# Patient Record
Sex: Female | Born: 1985 | Race: White | Hispanic: No | Marital: Single | Smoking: Current every day smoker
Health system: Southern US, Community
[De-identification: ages and names within clinical notes are randomized; demographics above are authoritative.]

## PROBLEM LIST (undated history)

## (undated) DIAGNOSIS — F32A Depression, unspecified: Secondary | ICD-10-CM

## (undated) DIAGNOSIS — F419 Anxiety disorder, unspecified: Secondary | ICD-10-CM

## (undated) DIAGNOSIS — B019 Varicella without complication: Secondary | ICD-10-CM

## (undated) DIAGNOSIS — F329 Major depressive disorder, single episode, unspecified: Secondary | ICD-10-CM

## (undated) DIAGNOSIS — R768 Other specified abnormal immunological findings in serum: Secondary | ICD-10-CM

## (undated) HISTORY — DX: Varicella without complication: B01.9

## (undated) HISTORY — DX: Other specified abnormal immunological findings in serum: R76.8

## (undated) HISTORY — DX: Major depressive disorder, single episode, unspecified: F32.9

## (undated) HISTORY — DX: Anxiety disorder, unspecified: F41.9

## (undated) HISTORY — DX: Depression, unspecified: F32.A

---
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
## 2007-08-09 ENCOUNTER — Emergency Department: Payer: Self-pay

## 2007-08-09 ENCOUNTER — Other Ambulatory Visit: Payer: Self-pay

## 2008-01-03 ENCOUNTER — Other Ambulatory Visit: Payer: Self-pay

## 2008-01-03 ENCOUNTER — Emergency Department: Payer: Self-pay | Admitting: Emergency Medicine

## 2008-04-07 ENCOUNTER — Emergency Department: Payer: Self-pay | Admitting: Internal Medicine

## 2008-04-28 ENCOUNTER — Emergency Department: Payer: Self-pay | Admitting: Emergency Medicine

## 2008-07-25 ENCOUNTER — Observation Stay: Payer: Self-pay

## 2008-07-26 ENCOUNTER — Ambulatory Visit: Payer: Self-pay

## 2008-07-28 ENCOUNTER — Inpatient Hospital Stay: Payer: Self-pay | Admitting: Obstetrics and Gynecology

## 2008-07-31 ENCOUNTER — Observation Stay: Payer: Self-pay | Admitting: Obstetrics and Gynecology

## 2008-08-05 ENCOUNTER — Observation Stay: Payer: Self-pay

## 2008-08-05 ENCOUNTER — Ambulatory Visit: Payer: Self-pay

## 2008-08-11 ENCOUNTER — Observation Stay: Payer: Self-pay

## 2008-08-19 ENCOUNTER — Observation Stay: Payer: Self-pay | Admitting: Certified Nurse Midwife

## 2008-08-22 ENCOUNTER — Observation Stay: Payer: Self-pay | Admitting: Obstetrics and Gynecology

## 2008-08-25 ENCOUNTER — Observation Stay: Payer: Self-pay

## 2008-08-25 ENCOUNTER — Other Ambulatory Visit: Payer: Self-pay

## 2008-08-28 ENCOUNTER — Observation Stay: Payer: Self-pay

## 2008-08-29 ENCOUNTER — Inpatient Hospital Stay: Payer: Self-pay

## 2008-09-04 ENCOUNTER — Ambulatory Visit: Payer: Self-pay | Admitting: Pain Medicine

## 2008-11-23 ENCOUNTER — Emergency Department: Payer: Self-pay | Admitting: Emergency Medicine

## 2010-07-08 ENCOUNTER — Emergency Department: Payer: Self-pay | Admitting: Internal Medicine

## 2010-11-21 HISTORY — PX: EYE SURGERY: SHX253

## 2012-03-07 ENCOUNTER — Ambulatory Visit: Payer: Self-pay | Admitting: Family Medicine

## 2012-03-20 ENCOUNTER — Ambulatory Visit: Payer: Self-pay | Admitting: Family Medicine

## 2012-06-21 ENCOUNTER — Emergency Department: Payer: Self-pay | Admitting: Emergency Medicine

## 2012-06-21 LAB — COMPREHENSIVE METABOLIC PANEL
Alkaline Phosphatase: 51 U/L (ref 50–136)
BUN: 13 mg/dL (ref 7–18)
Bilirubin,Total: 0.5 mg/dL (ref 0.2–1.0)
Calcium, Total: 8.8 mg/dL (ref 8.5–10.1)
Co2: 27 mmol/L (ref 21–32)
Creatinine: 0.69 mg/dL (ref 0.60–1.30)
EGFR (Non-African Amer.): >60
Glucose: 114 mg/dL — ABNORMAL HIGH (ref 65–99)
Osmolality: 282 (ref 275–301)
SGOT(AST): 29 U/L (ref 15–37)
SGPT (ALT): 33 U/L
Total Protein: 7.4 g/dL (ref 6.4–8.2)

## 2012-06-21 LAB — URINALYSIS, COMPLETE
Glucose,UR: NEGATIVE mg/dL (ref 0–75)
Ketone: NEGATIVE
Nitrite: NEGATIVE
Ph: 5 (ref 4.5–8.0)
Protein: NEGATIVE
RBC,UR: 3 /HPF (ref 0–5)
WBC UR: NONE SEEN /HPF (ref 0–5)

## 2012-06-21 LAB — LIPASE, BLOOD: Lipase: 161 U/L (ref 73–393)

## 2012-06-21 LAB — CBC
HCT: 38.4 % (ref 35.0–47.0)
MCHC: 34.9 g/dL (ref 32.0–36.0)
Platelet: 152 10*3/uL (ref 150–440)
RDW: 13 % (ref 11.5–14.5)

## 2012-06-21 LAB — PREGNANCY, URINE: Pregnancy Test, Urine: NEGATIVE m[IU]/mL

## 2012-12-21 ENCOUNTER — Emergency Department: Payer: Self-pay | Admitting: Emergency Medicine

## 2013-06-15 ENCOUNTER — Emergency Department: Payer: Self-pay | Admitting: Emergency Medicine
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
## 2013-08-20 ENCOUNTER — Emergency Department: Payer: Self-pay | Admitting: Emergency Medicine

## 2013-08-20 LAB — CBC
HGB: 13.3 g/dL (ref 12.0–16.0)
MCH: 33.2 pg (ref 26.0–34.0)
MCHC: 35.8 g/dL (ref 32.0–36.0)
Platelet: 164 10*3/uL (ref 150–440)
RBC: 4 10*6/uL (ref 3.80–5.20)
RDW: 13.3 % (ref 11.5–14.5)
WBC: 9.2 10*3/uL (ref 3.6–11.0)

## 2013-08-21 LAB — DRUG SCREEN, URINE
Barbiturates, Ur Screen: NEGATIVE (ref ?–200)
Cannabinoid 50 Ng, Ur ~~LOC~~: NEGATIVE (ref ?–50)
Cocaine Metabolite,Ur ~~LOC~~: NEGATIVE (ref ?–300)
MDMA (Ecstasy)Ur Screen: NEGATIVE (ref ?–500)
Methadone, Ur Screen: NEGATIVE (ref ?–300)
Opiate, Ur Screen: NEGATIVE (ref ?–300)
Tricyclic, Ur Screen: NEGATIVE (ref ?–1000)

## 2013-08-21 LAB — COMPREHENSIVE METABOLIC PANEL
Anion Gap: 5 — ABNORMAL LOW (ref 7–16)
BUN: 13 mg/dL (ref 7–18)
Chloride: 108 mmol/L — ABNORMAL HIGH (ref 98–107)
Creatinine: 0.77 mg/dL (ref 0.60–1.30)
EGFR (African American): >60
EGFR (Non-African Amer.): >60
Osmolality: 278 (ref 275–301)
SGOT(AST): 33 U/L (ref 15–37)
SGPT (ALT): 41 U/L (ref 12–78)
Total Protein: 7.1 g/dL (ref 6.4–8.2)

## 2013-08-21 LAB — URINALYSIS, COMPLETE
Bacteria: NONE SEEN
Bilirubin,UR: NEGATIVE
Glucose,UR: NEGATIVE mg/dL (ref 0–75)
Leukocyte Esterase: NEGATIVE
Ph: 7 (ref 4.5–8.0)
RBC,UR: <1 /HPF (ref 0–5)
Squamous Epithelial: 8
WBC UR: 1 /HPF (ref 0–5)

## 2013-08-21 LAB — ETHANOL: Ethanol: <3 mg/dL

## 2013-08-21 LAB — TSH: Thyroid Stimulating Horm: 2.5 u[IU]/mL

## 2013-10-24 ENCOUNTER — Emergency Department: Payer: Self-pay | Admitting: Internal Medicine

## 2013-11-21 DIAGNOSIS — R768 Other specified abnormal immunological findings in serum: Secondary | ICD-10-CM

## 2013-11-21 HISTORY — DX: Other specified abnormal immunological findings in serum: R76.8

## 2013-12-10 LAB — HM PAP SMEAR: HM Pap smear: NORMAL

## 2015-01-23 ENCOUNTER — Ambulatory Visit (INDEPENDENT_AMBULATORY_CARE_PROVIDER_SITE_OTHER): Payer: 59 | Admitting: Nurse Practitioner

## 2015-01-23 ENCOUNTER — Encounter: Payer: Self-pay | Admitting: Nurse Practitioner

## 2015-01-23 ENCOUNTER — Telehealth: Payer: Self-pay | Admitting: Nurse Practitioner

## 2015-01-23 VITALS — BP 102/62 | HR 72 | Temp 97.6°F | Ht 63.5 in | Wt 163.0 lb

## 2015-01-23 DIAGNOSIS — Z0000 Encounter for general adult medical examination without abnormal findings: Secondary | ICD-10-CM | POA: Diagnosis not present

## 2015-01-23 DIAGNOSIS — Z7689 Persons encountering health services in other specified circumstances: Secondary | ICD-10-CM

## 2015-01-23 DIAGNOSIS — F418 Other specified anxiety disorders: Secondary | ICD-10-CM

## 2015-01-23 DIAGNOSIS — G479 Sleep disorder, unspecified: Secondary | ICD-10-CM

## 2015-01-23 DIAGNOSIS — R894 Abnormal immunological findings in specimens from other organs, systems and tissues: Secondary | ICD-10-CM

## 2015-01-23 DIAGNOSIS — Z9189 Other specified personal risk factors, not elsewhere classified: Secondary | ICD-10-CM

## 2015-01-23 LAB — CBC WITH DIFFERENTIAL/PLATELET
Basophils Absolute: 0 10*3/uL (ref 0.0–0.1)
Basophils Relative: 0.2 % (ref 0.0–3.0)
EOS ABS: 0.1 10*3/uL (ref 0.0–0.7)
Eosinophils Relative: 0.9 % (ref 0.0–5.0)
HCT: 38.3 % (ref 36.0–46.0)
Hemoglobin: 13.3 g/dL (ref 12.0–15.0)
Lymphocytes Relative: 20 % (ref 12.0–46.0)
Lymphs Abs: 1.6 10*3/uL (ref 0.7–4.0)
MCHC: 34.9 g/dL (ref 30.0–36.0)
MCV: 91.2 fl (ref 78.0–100.0)
MONO ABS: 0.3 10*3/uL (ref 0.1–1.0)
Monocytes Relative: 3.1 % (ref 3.0–12.0)
Neutro Abs: 6.3 10*3/uL (ref 1.4–7.7)
Neutrophils Relative %: 75.8 % (ref 43.0–77.0)
Platelets: 180 10*3/uL (ref 150.0–400.0)
RBC: 4.19 Mil/uL (ref 3.87–5.11)
RDW: 12.9 % (ref 11.5–15.5)
WBC: 8.3 10*3/uL (ref 4.0–10.5)

## 2015-01-23 LAB — COMPREHENSIVE METABOLIC PANEL
ALT: 15 U/L (ref 0–35)
AST: 14 U/L (ref 0–37)
Albumin: 4.2 g/dL (ref 3.5–5.2)
Alkaline Phosphatase: 24 U/L — ABNORMAL LOW (ref 39–117)
BILIRUBIN TOTAL: 0.5 mg/dL (ref 0.2–1.2)
BUN: 10 mg/dL (ref 6–23)
CO2: 26 meq/L (ref 19–32)
Calcium: 9.6 mg/dL (ref 8.4–10.5)
Chloride: 106 mEq/L (ref 96–112)
Creatinine, Ser: 0.78 mg/dL (ref 0.40–1.20)
GFR: 94.58 mL/min (ref >60.00)
Glucose, Bld: 88 mg/dL (ref 70–99)
Potassium: 4.2 mEq/L (ref 3.5–5.1)
Sodium: 136 mEq/L (ref 135–145)
Total Protein: 7.3 g/dL (ref 6.0–8.3)

## 2015-01-23 LAB — TSH: TSH: 1.01 u[IU]/mL (ref 0.35–4.50)

## 2015-01-23 LAB — LIPID PANEL
Cholesterol: 173 mg/dL (ref 0–200)
HDL: 40.9 mg/dL (ref >39.00)
LDL CALC: 111 mg/dL — AB (ref 0–99)
NonHDL: 132.1
TRIGLYCERIDES: 108 mg/dL (ref 0.0–149.0)
Total CHOL/HDL Ratio: 4
VLDL: 21.6 mg/dL (ref 0.0–40.0)

## 2015-01-23 LAB — HEMOGLOBIN A1C: Hgb A1c MFr Bld: 5.4 % (ref 4.6–6.5)

## 2015-01-23 MED ORDER — TRAZODONE HCL 50 MG PO TABS: 50.0000 mg | ORAL_TABLET | Freq: Every day | ORAL | Status: AC

## 2015-01-23 NOTE — Patient Instructions (Signed)
Insomnia Insomnia is frequent trouble falling and/or staying asleep. Insomnia can be a long term problem or a short term problem. Both are common. Insomnia can be a short term problem when the wakefulness is related to a certain stress or worry. Long term insomnia is often related to ongoing stress during waking hours and/or poor sleeping habits. Overtime, sleep deprivation itself can make the problem worse. Every little thing feels more severe because you are overtired and your ability to cope is decreased. CAUSES   Stress, anxiety, and depression.  Poor sleeping habits.  Distractions such as TV in the bedroom.  Naps close to bedtime.  Engaging in emotionally charged conversations before bed.  Technical reading before sleep.  Alcohol and other sedatives. They may make the problem worse. They can hurt normal sleep patterns and normal dream activity.  Stimulants such as caffeine for several hours prior to bedtime.  Pain syndromes and shortness of breath can cause insomnia.  Exercise late at night.  Changing time zones may cause sleeping problems (jet lag). It is sometimes helpful to have someone observe your sleeping patterns. They should look for periods of not breathing during the night (sleep apnea). They should also look to see how long those periods last. If you live alone or observers are uncertain, you can also be observed at a sleep clinic where your sleep patterns will be professionally monitored. Sleep apnea requires a checkup and treatment. Give your caregivers your medical history. Give your caregivers observations your family has made about your sleep.  SYMPTOMS   Not feeling rested in the morning.  Anxiety and restlessness at bedtime.  Difficulty falling and staying asleep. TREATMENT   Your caregiver may prescribe treatment for an underlying medical disorders. Your caregiver can give advice or help if you are using alcohol or other drugs for self-medication. Treatment  of underlying problems will usually eliminate insomnia problems.  Medications can be prescribed for short time use. They are generally not recommended for lengthy use.  Over-the-counter sleep medicines are not recommended for lengthy use. They can be habit forming.  You can promote easier sleeping by making lifestyle changes such as:  Using relaxation techniques that help with breathing and reduce muscle tension.  Exercising earlier in the day.  Changing your diet and the time of your last meal. No night time snacks.  Establish a regular time to go to bed.  Counseling can help with stressful problems and worry.  Soothing music and white noise may be helpful if there are background noises you cannot remove.  Stop tedious detailed work at least one hour before bedtime. HOME CARE INSTRUCTIONS   Keep a diary. Inform your caregiver about your progress. This includes any medication side effects. See your caregiver regularly. Take note of:  Times when you are asleep.  Times when you are awake during the night.  The quality of your sleep.  How you feel the next day. This information will help your caregiver care for you.  Get out of bed if you are still awake after 15 minutes. Read or do some quiet activity. Keep the lights down. Wait until you feel sleepy and go back to bed.  Keep regular sleeping and waking hours. Avoid naps.  Exercise regularly.  Avoid distractions at bedtime. Distractions include watching television or engaging in any intense or detailed activity like attempting to balance the household checkbook.  Develop a bedtime ritual. Keep a familiar routine of bathing, brushing your teeth, climbing into bed at the same   time each night, listening to soothing music. Routines increase the success of falling to sleep faster.  Use relaxation techniques. This can be using breathing and muscle tension release routines. It can also include visualizing peaceful scenes. You can  also help control troubling or intruding thoughts by keeping your mind occupied with boring or repetitive thoughts like the old concept of counting sheep. You can make it more creative like imagining planting one beautiful flower after another in your backyard garden.  During your day, work to eliminate stress. When this is not possible use some of the previous suggestions to help reduce the anxiety that accompanies stressful situations. MAKE SURE YOU:   Understand these instructions.  Will watch your condition.  Will get help right away if you are not doing well or get worse. Document Released: 11/04/2000 Document Revised: 01/30/2012 Document Reviewed: 12/05/2007 Exit[employer] Patient Information 2015 [employer]. This information is not intended to replace advice given to you by your health care provider. Make sure you discuss any questions you have with your health care provider.  

## 2015-01-23 NOTE — Telephone Encounter (Signed)
emmi mailed  °

## 2015-01-23 NOTE — Progress Notes (Signed)
Subjective:    Patient ID: Allison Morgan, female    DOB: 02/04/1986, 29 y.o.   MRN: [id number]  HPI  Allison Morgan is a 29 yo female establishing care and anxious/sleep disturbance complaints.   1) New Pt info-   Diet- Doesn't eat a lot of beef   Exercise- No formal   Immunizations- Refuses Flu, UTD on tdap   Pap- 2015 Jan. Full STD panel also- HSV2 positive   Eye Exam- Up to date  Dental Exam- UTD  2) Chronic Problems-  Poor circulation in feet- Would like B12 checked   Tobacco abuse- E-cigs and cold [place]- not helpful    Wants to try patches or gum   3) Acute Problems-  3 weeks not sleeping well. Stressors home, men, and work recently. Left boyfriend, tries Benadryl 2 at night, wakes up often at night, relaxes her to fall asleep. Not rested in the morning. Ambien caused hallucinations. Melatonin not helpful. Spoke to therapist in past and not helpful/expensive. Mind races before going to bed.   Overdosed years ago and had her stomach pumped. She found out she was pregnant and that gave her reason to live.  Lexapro and paxil- not helpful in past.   Bruises easily  Menstrual periods- normal   Review of Systems  Constitutional: Negative for fever, chills, diaphoresis and fatigue.  Eyes: Negative for visual disturbance.  Respiratory: Negative for chest tightness, shortness of breath and wheezing.   Cardiovascular: Negative for chest pain, palpitations and leg swelling.  Gastrointestinal: Negative for nausea, vomiting, diarrhea and constipation.  Endocrine: Positive for cold intolerance.  Skin: Negative for rash.  Neurological: Negative for dizziness, weakness, numbness and headaches.  Hematological: Bruises/bleeds easily.  Psychiatric/Behavioral: Positive for suicidal ideas, sleep disturbance and self-injury. The patient is nervous/anxious.        Suicidal ideations in past, no plan, not happening currently. Self-injury many years ago, overdosed once, and cut wrists.    Past  Medical History  Diagnosis Date  . Chicken pox   . Depression   . HSV-2 seropositive 2015  . Anxiety     History   Social History  . Marital Status: Single    Spouse Name: N/A  . Number of Children: N/A  . Years of Education: N/A   Occupational History  . Not on file.   Social History Main Topics  . Smoking status: Current Every Day Smoker -- 1.00 packs/day    Types: Cigarettes  . Smokeless tobacco: Never Used  . Alcohol Use: 2.4 oz/week    4 Standard drinks or equivalent per week  . Drug Use: Yes     Comment: Social use of cocaine or pain pills, less than once a month   . Sexual Activity:    Partners: Male     Comment: 1 partner    Other Topics Concern  . Not on file   Social History Narrative   Works in [employer]   Lives by herself   1 576 yo daughter    1 dog and 1 ferret    Enjoys outdoors- hunting/fishing/horses        Past Surgical History  Procedure Laterality Date  . Appendectomy  2007  . Wisdom tooth extraction N/A 2004    All 4   . Eye surgery Left 2012    removal of birth mark   . Eye surgery Left 1991    Fatty tissue removal     Family History  Problem Relation Age of Onset  .  Depression Mother   . Alcohol abuse Maternal Grandfather   . Cancer Maternal Grandfather     lung   . Alcohol abuse Paternal Grandfather   . Thyroid cancer Paternal Grandfather   . Thyroid cancer Sister     Thyroid removed, 2007  . Thyroid cancer Maternal Aunt   . Thyroid cancer Maternal Uncle     Allergies  Allergen Reactions  . Ambien [Zolpidem Tartrate]     Hallucination    No current outpatient prescriptions on file prior to visit.   No current facility-administered medications on file prior to visit.      Objective:   Physical Exam  Constitutional: She is oriented to person, place, and time. She appears well-developed and well-nourished. No distress.  BP 102/62 mmHg  Pulse 72  Temp(Src) 97.6 F (36.4 C) (Oral)  Ht 5' 3.5" (1.613 m)  Wt 163 lb  (73.936 kg)  BMI 28.42 kg/m2  SpO2 98%   HENT:  Head: Normocephalic and atraumatic.  Right Ear: External ear normal.  Left Ear: External ear normal.  Eyes: Right eye exhibits no discharge. Left eye exhibits no discharge. No scleral icterus.  Neck: Normal range of motion. Neck supple. No thyromegaly present.  Cardiovascular: Normal rate, regular rhythm, normal heart sounds and intact distal pulses.  Exam reveals no gallop and no friction rub.   No murmur heard. Pulmonary/Chest: Effort normal and breath sounds normal. No respiratory distress. She has no wheezes. She has no rales. She exhibits no tenderness.  Lymphadenopathy:    She has no cervical adenopathy.  Neurological: She is alert and oriented to person, place, and time. No cranial nerve deficit. She exhibits normal muscle tone. Coordination normal.  Skin: Skin is warm and dry. No rash noted. She is not diaphoretic.  Tattoos and piercings present.   Psychiatric: She has a normal mood and affect. Her behavior is normal. Judgment and thought content normal.      Assessment & Plan:

## 2015-01-23 NOTE — Progress Notes (Signed)
Pre visit review using our clinic review tool, if applicable. No additional management support is needed unless otherwise documented below in the visit note. 

## 2015-01-28 DIAGNOSIS — Z7689 Persons encountering health services in other specified circumstances: Secondary | ICD-10-CM | POA: Insufficient documentation

## 2015-01-28 DIAGNOSIS — G479 Sleep disorder, unspecified: Secondary | ICD-10-CM | POA: Insufficient documentation

## 2015-01-28 DIAGNOSIS — R894 Abnormal immunological findings in specimens from other organs, systems and tissues: Secondary | ICD-10-CM | POA: Insufficient documentation

## 2015-01-28 DIAGNOSIS — F418 Other specified anxiety disorders: Secondary | ICD-10-CM | POA: Insufficient documentation

## 2015-01-28 DIAGNOSIS — Z9189 Other specified personal risk factors, not elsewhere classified: Secondary | ICD-10-CM | POA: Insufficient documentation

## 2015-01-28 NOTE — Assessment & Plan Note (Deleted)
In past.

## 2015-01-28 NOTE — Assessment & Plan Note (Signed)
Discussed acute and chronic issues. Reviewed health maintenance measures, PFSHx, and immunizations. Obtain routine labs TSH, Lipid panel, CBC w/ diff, A1c, and CMET.   

## 2015-01-28 NOTE — Assessment & Plan Note (Signed)
Will try trazodone at night for sleep, depression, and anxiety. Informed pt it will

## 2015-01-28 NOTE — Assessment & Plan Note (Signed)
Will try Trazodone 50 mg at night for depression/anxiety and sleep. Will follow in 6 weeks to titrate

## 2015-01-28 NOTE — Assessment & Plan Note (Signed)
No outbreaks recently. Will follow

## 2015-02-12 ENCOUNTER — Encounter: Payer: Self-pay | Admitting: Nurse Practitioner

## 2015-02-12 ENCOUNTER — Telehealth: Payer: Self-pay | Admitting: *Deleted

## 2015-02-12 ENCOUNTER — Ambulatory Visit (INDEPENDENT_AMBULATORY_CARE_PROVIDER_SITE_OTHER): Payer: 59 | Admitting: Nurse Practitioner

## 2015-02-12 VITALS — BP 122/62 | HR 81 | Temp 98.1°F | Ht 63.5 in | Wt 160.2 lb

## 2015-02-12 DIAGNOSIS — J209 Acute bronchitis, unspecified: Secondary | ICD-10-CM | POA: Diagnosis not present

## 2015-02-12 MED ORDER — AZITHROMYCIN 250 MG PO TABS: ORAL_TABLET | ORAL | Status: AC

## 2015-02-12 NOTE — Progress Notes (Signed)
   Subjective:    Patient ID: Allison Morgan, female    DOB: 05/08/1986, 29 y.o.   MRN: [id number]  HPI  Ms. Allison Morgan is a 29 yo female with a CC of cough and congestion x 1 week.   1) 1 week ago Monday, nasal congestion, sneezing, Saturday facial swelling, coughing- dry, worse at night- cough. No longer sneezing or facial pressure.  Treatment to date:   Cough drops- helpful   Sinus/cold pill form- helpful   Review of Systems  Constitutional: Negative for fever, chills, diaphoresis and fatigue.  Respiratory: Positive for cough, shortness of breath and wheezing. Negative for chest tightness.   Cardiovascular: Negative for chest pain, palpitations and leg swelling.  Gastrointestinal: Negative for nausea, vomiting and diarrhea.  Skin: Negative for rash.  Neurological: Negative for dizziness, weakness, numbness and headaches.  Psychiatric/Behavioral: The patient is not nervous/anxious.        Objective:   Physical Exam  Constitutional: She is oriented to person, place, and time. She appears well-developed and well-nourished. No distress.  BP 122/62 mmHg  Pulse 81  Temp(Src) 98.1 F (36.7 C) (Oral)  Ht 5' 3.5" (1.613 m)  Wt 160 lb 4 oz (72.689 kg)  BMI 27.94 kg/m2  SpO2 97%   HENT:  Head: Normocephalic and atraumatic.  Right Ear: External ear normal.  Left Ear: External ear normal.  Mouth/Throat: No oropharyngeal exudate.  Oropharyngeal erythema.   Cardiovascular: Normal rate, regular rhythm, normal heart sounds and intact distal pulses.  Exam reveals no gallop and no friction rub.   No murmur heard. Pulmonary/Chest: Effort normal. No respiratory distress. She has wheezes. She has no rales. She exhibits no tenderness.  Rhonchi  Neurological: She is alert and oriented to person, place, and time. No cranial nerve deficit. She exhibits normal muscle tone. Coordination normal.  Skin: Skin is warm and dry. No rash noted. She is not diaphoretic.  Psychiatric: She has a normal mood and  affect. Her behavior is normal. Judgment and thought content normal.      Assessment & Plan:

## 2015-02-12 NOTE — Assessment & Plan Note (Signed)
Will try Z-pack with probiotics recommended. Delsym cough syrup OTC recommended. FU prn worsening/failure to improve.

## 2015-02-12 NOTE — Patient Instructions (Signed)
Take Z-pack as directed.   Please take a probiotic ( Align, Floraque or Culturelle) while you are on the antibiotic to prevent a serious antibiotic associated diarrhea  Called clostirudium dificile colitis and a vaginal yeast infection   Delsym cough syrup OTC is the strongest available.

## 2015-02-12 NOTE — Telephone Encounter (Signed)
Pt called, requesting Rx to be called in for bronchitis. Advised she would need to be seen in office for Rx. Appt scheduled for today at 2:00

## 2015-02-20 ENCOUNTER — Telehealth: Payer: Self-pay

## 2015-02-20 ENCOUNTER — Ambulatory Visit: Payer: 59 | Admitting: Nurse Practitioner

## 2015-02-20 DIAGNOSIS — Z0289 Encounter for other administrative examinations: Secondary | ICD-10-CM

## 2015-02-20 NOTE — Telephone Encounter (Signed)
Tried calling pt to inquire about no-show for follow up apt, the message stated the number listed has been disconnected.

## 2015-03-13 NOTE — Consult Note (Signed)
Brief Consult Note: Diagnosis: bipolar disorder nos.   Patient was seen by consultant.   Consult note dictated.   Discussed with Attending MD.   Comments: Psychiatry: Patient seen. Chart reviewed. She now denies any suiciddal ideation and is calm and lucid with good insight. Has outpt appt in place. Sugggest release from ER. IVC discontinued.  Electronic Signatures: Audery Amellapacs, John T (MD)  (Signed 01-Oct-14 17:32)  Authored: Brief Consult Note   Last Updated: 01-Oct-14 17:32 by Audery Amellapacs, John T (MD)

## 2015-03-13 NOTE — Consult Note (Signed)
PATIENT NAME:  Allison Morgan, Allison Morgan MR#:  [id number] DATE OF BIRTH:  12-06-1985  DATE OF CONSULTATION:  08/21/2013  CONSULTING PHYSICIAN:  Audery AmelJohn T. Clapacs, MD  IDENTIFYING INFORMATION AND REASON FOR CONSULT: A 29 year old woman with a history of bipolar disorder who came into the hospital reporting suicidal ideation.   CURRENT CHIEF COMPLAINT: "I'm not suicidal." She has consult for appropriate psychiatric disposition.   HISTORY OF PRESENT ILLNESS: Information obtained from the patient and the chart. The patient states that she has been feeling like she is under a lot of stress recently. She moved out of her own apartment and moved in with her parents. This resulted in greater conflict between her and her mother. They had an argument yesterday about whether or not her mother was going to drive her to work. It culminated in the patient making suicidal threats. She did not actually do anything to try to harm herself.   She also has not been aggressive or violent to anyone else. Denies that she is having any psychotic symptoms. Does say that her mood stays irritable and anxious a lot of the time, but she has overall been able to function reasonably well. She is compliant with her outpatient treatment and takes her medication. She has not been abusing drugs or alcohol recently. The patient says that she is feeling much better now and absolutely denies suicidal ideation.   PAST PSYCHIATRIC HISTORY: Reports that she has had 1 psychiatric [facility] after taking an overdose. No other hospitalizations. No other suicide attempts reported. Has been diagnosed with bipolar disorder by Dr. Imogene Burnhen, who she sees for outpatient treatment.   She is currently taking Depakote 500 mg a day and Lexapro 20 mg a day and was also ordered to start Abilify and lithium. She has started the Abilify 5 mg a day but has not yet started taking the lithium.   SUBSTANCE ABUSE HISTORY: Says  she does not drink very much. Occasionally has dabbled in drug use. Most recently used a half of a Percocet about a week ago but does not have any other ongoing substance abuse problems.   PAST MEDICAL HISTORY: Does not have any significant ongoing medical problems.   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 She has a 27-year-old daughter. She cites her daughter and her work as both being important things in her life and reasons why she would not want to kill herself. She is currently living with her mother and that is stressful for her.   CURRENT MEDICATIONS: Lexapro 20 mg p.o. daily, Depakote 500 mg p.o. at bedtime, Abilify 5 mg p.o. at bedtime,   ALLERGIES: AMBIEN AND AMOXICILLIN.   REVIEW OF SYSTEMS: Right now she denies any mood symptoms. Denies depression. Denies anxiety. Denies anger or irritability. Overall, she has been sleeping reasonable well. Diet has been normal. No complaints of acute pain or other physical symptoms. No suicidal or homicidal ideation. No hallucinations.   MENTAL STATUS EXAMINATION: Slightly disheveled young woman, looks her stated age. Cooperative with the interview. Psychomotor activity normal. Eye contact appropriate. Mood stated as being okay. Affect appears to be euthymic and reactive and appropriate. Thoughts appear to be lucid with no loosening of associations or delusions. No evidence of any delusional content. Denies auditory or visual hallucinations. Denies any suicidal or homicidal ideation. Able to cite multiple positive things in her life to live for. Good insight and judgment. Normal intelligence. Alert  and oriented x4.   LABORATORY RESULTS: Drug screen negative. Urinalysis unremarkable. TSH normal at 2.5. Alcohol undetected. Chemistry shows elevated chloride 108, alkaline phosphatase low at 46. CBC normal.   ASSESSMENT: A 29 year old woman with a history of bipolar disorder who yesterday got angry at her mother and made a  suicidal threat but did not actually carry through on it. She has not shown any dangerous behavior here in the hospital. At this point, she is calm and lucid without any psychotic symptoms and denies suicidal ideation. Shows improved insight and is able to describe positive things in her life to live for. No clear indication for hospitalization and no longer meeting involuntary commitment criteria.   TREATMENT PLAN: Suggest that she be released from the Emergency Room. The patient was educated and counseled about medication compliance and also about working on stabilizing her mood and behavior. She has a follow-up appointment with her outpatient psychiatrist tomorrow that she will attend. The patient is agreeable to the plan. Discussed the case with the Emergency Room doctor who agrees.   DIAGNOSIS, PRINCIPAL AND PRIMARY:  AXIS I: Bipolar disorder, not otherwise specified.  AXIS II: No diagnosis.  AXIS III: No diagnosis.  AXIS IV: Moderate to severe stress from financial and social problems, being a single mother, living with her parents.  AXIS V: Functioning at time of evaluation is 57.   ____________________________ Audery Amel, MD jtc:[id number] D: 08/21/2013 17:38:17 ET T: 08/21/2013 19:16:53 ET JOB#: [id number]  cc: Audery Amel, MD, <Dictator> Audery Amel MD ELECTRONICALLY SIGNED 08/21/2013 20:10
